# Patient Record
Sex: Female | Born: 1950 | Race: White | Hispanic: No | Marital: Married | Smoking: Never smoker
Health system: Southern US, Community
[De-identification: ages and names within clinical notes are randomized; demographics above are authoritative.]

## PROBLEM LIST (undated history)

## (undated) DIAGNOSIS — R002 Palpitations: Secondary | ICD-10-CM

## (undated) DIAGNOSIS — I491 Atrial premature depolarization: Secondary | ICD-10-CM

## (undated) DIAGNOSIS — E669 Obesity, unspecified: Secondary | ICD-10-CM

## (undated) HISTORY — PX: TONSILLECTOMY: SUR1361

## (undated) HISTORY — PX: TUBAL LIGATION: SHX77

---

## 2015-01-06 ENCOUNTER — Other Ambulatory Visit: Payer: Self-pay | Admitting: Otolaryngology

## 2015-01-06 DIAGNOSIS — H905 Unspecified sensorineural hearing loss: Secondary | ICD-10-CM

## 2015-01-19 ENCOUNTER — Ambulatory Visit: Payer: PPO

## 2015-02-09 ENCOUNTER — Ambulatory Visit: Payer: PPO

## 2015-03-09 ENCOUNTER — Ambulatory Visit: Payer: PPO

## 2015-03-17 ENCOUNTER — Ambulatory Visit: Payer: PPO

## 2015-03-30 ENCOUNTER — Ambulatory Visit
Admission: RE | Admit: 2015-03-30 | Discharge: 2015-03-30 | Disposition: A | Discharge status: Home / Self Care | Payer: PPO | Source: Ambulatory Visit | Attending: Otolaryngology | Admitting: Otolaryngology

## 2015-03-30 ENCOUNTER — Ambulatory Visit: Admission: RE | Admit: 2015-03-30 | Payer: PPO | Source: Ambulatory Visit

## 2015-03-30 DIAGNOSIS — H90A22 Sensorineural hearing loss, unilateral, left ear, with restricted hearing on the contralateral side: Secondary | ICD-10-CM | POA: Diagnosis not present

## 2015-03-30 DIAGNOSIS — H933X2 Disorders of left acoustic nerve: Secondary | ICD-10-CM | POA: Diagnosis not present

## 2015-03-30 DIAGNOSIS — H9193 Unspecified hearing loss, bilateral: Secondary | ICD-10-CM | POA: Diagnosis not present

## 2015-03-30 DIAGNOSIS — R9082 White matter disease, unspecified: Secondary | ICD-10-CM | POA: Insufficient documentation

## 2015-03-30 MED ORDER — GADOBENATE DIMEGLUMINE 529 MG/ML IV SOLN
20.0000 mL | Freq: Once | INTRAVENOUS | Status: AC | PRN
  Administered 2015-03-30: 20 mL via INTRAVENOUS

## 2015-04-28 DIAGNOSIS — H9193 Unspecified hearing loss, bilateral: Secondary | ICD-10-CM | POA: Diagnosis not present

## 2015-04-28 DIAGNOSIS — R05 Cough: Secondary | ICD-10-CM | POA: Diagnosis not present

## 2015-04-28 DIAGNOSIS — W57XXXA Bitten or stung by nonvenomous insect and other nonvenomous arthropods, initial encounter: Secondary | ICD-10-CM | POA: Diagnosis not present

## 2015-04-28 DIAGNOSIS — B078 Other viral warts: Secondary | ICD-10-CM | POA: Diagnosis not present

## 2015-04-28 DIAGNOSIS — R0789 Other chest pain: Secondary | ICD-10-CM | POA: Diagnosis not present

## 2015-06-16 DIAGNOSIS — Z1211 Encounter for screening for malignant neoplasm of colon: Secondary | ICD-10-CM | POA: Diagnosis not present

## 2015-06-23 DIAGNOSIS — R0789 Other chest pain: Secondary | ICD-10-CM | POA: Diagnosis not present

## 2015-06-23 DIAGNOSIS — I491 Atrial premature depolarization: Secondary | ICD-10-CM | POA: Diagnosis not present

## 2015-06-23 DIAGNOSIS — R002 Palpitations: Secondary | ICD-10-CM | POA: Diagnosis not present

## 2015-08-12 ENCOUNTER — Encounter: Payer: Self-pay | Admitting: *Deleted

## 2015-08-13 ENCOUNTER — Ambulatory Visit
Admission: RE | Admit: 2015-08-13 | Discharge: 2015-08-13 | Disposition: A | Discharge status: Home / Self Care | Payer: PPO | Source: Ambulatory Visit | Attending: Unknown Physician Specialty | Admitting: Unknown Physician Specialty

## 2015-08-13 ENCOUNTER — Encounter
Admission: RE | Disposition: A | Discharge status: Home / Self Care | Payer: Self-pay | Source: Ambulatory Visit | Attending: Unknown Physician Specialty

## 2015-08-13 ENCOUNTER — Ambulatory Visit: Payer: PPO | Admitting: Anesthesiology

## 2015-08-13 ENCOUNTER — Encounter: Payer: Self-pay | Admitting: Anesthesiology

## 2015-08-13 DIAGNOSIS — K640 First degree hemorrhoids: Secondary | ICD-10-CM | POA: Diagnosis not present

## 2015-08-13 DIAGNOSIS — Z6830 Body mass index (BMI) 30.0-30.9, adult: Secondary | ICD-10-CM | POA: Insufficient documentation

## 2015-08-13 DIAGNOSIS — K635 Polyp of colon: Secondary | ICD-10-CM | POA: Diagnosis not present

## 2015-08-13 DIAGNOSIS — K621 Rectal polyp: Secondary | ICD-10-CM | POA: Diagnosis not present

## 2015-08-13 DIAGNOSIS — K5790 Diverticulosis of intestine, part unspecified, without perforation or abscess without bleeding: Secondary | ICD-10-CM | POA: Diagnosis not present

## 2015-08-13 DIAGNOSIS — Z7982 Long term (current) use of aspirin: Secondary | ICD-10-CM | POA: Insufficient documentation

## 2015-08-13 DIAGNOSIS — K5730 Diverticulosis of large intestine without perforation or abscess without bleeding: Secondary | ICD-10-CM | POA: Insufficient documentation

## 2015-08-13 DIAGNOSIS — E669 Obesity, unspecified: Secondary | ICD-10-CM | POA: Insufficient documentation

## 2015-08-13 DIAGNOSIS — Z1211 Encounter for screening for malignant neoplasm of colon: Secondary | ICD-10-CM | POA: Diagnosis not present

## 2015-08-13 DIAGNOSIS — D128 Benign neoplasm of rectum: Secondary | ICD-10-CM | POA: Diagnosis not present

## 2015-08-13 DIAGNOSIS — K648 Other hemorrhoids: Secondary | ICD-10-CM | POA: Diagnosis not present

## 2015-08-13 HISTORY — PX: COLONOSCOPY WITH PROPOFOL: SHX5780

## 2015-08-13 HISTORY — DX: Palpitations: R00.2

## 2015-08-13 HISTORY — DX: Obesity, unspecified: E66.9

## 2015-08-13 HISTORY — DX: Atrial premature depolarization: I49.1

## 2015-08-13 SURGERY — COLONOSCOPY WITH PROPOFOL
Anesthesia: General

## 2015-08-13 MED ORDER — LIDOCAINE HCL (CARDIAC) 20 MG/ML IV SOLN
INTRAVENOUS | Status: DC | PRN
  Administered 2015-08-13: 30 mg via INTRAVENOUS

## 2015-08-13 MED ORDER — PROPOFOL 10 MG/ML IV BOLUS
INTRAVENOUS | Status: DC | PRN
  Administered 2015-08-13: 90 mg via INTRAVENOUS

## 2015-08-13 MED ORDER — SODIUM CHLORIDE 0.9 % IV SOLN: INTRAVENOUS | Status: DC

## 2015-08-13 MED ORDER — SODIUM CHLORIDE 0.9 % IV SOLN
INTRAVENOUS | Status: DC
  Administered 2015-08-13: 1000 mL via INTRAVENOUS

## 2015-08-13 MED ORDER — PROPOFOL 500 MG/50ML IV EMUL
INTRAVENOUS | Status: DC | PRN
  Administered 2015-08-13: 125 ug/kg/min via INTRAVENOUS

## 2015-08-13 NOTE — Anesthesia Preprocedure Evaluation (Signed)
Anesthesia Evaluation  Patient identified by MRN, date of birth, ID band Patient awake    Reviewed: Allergy & Precautions, H&P , NPO status , Patient's Chart, lab work & pertinent test results, reviewed documented beta blocker date and time   History of Anesthesia Complications (+) PROLONGED EMERGENCE and history of anesthetic complications  Airway Mallampati: I  TM Distance: >3 FB Neck ROM: full    Dental no notable dental hx. (+) Missing   Pulmonary neg shortness of breath, asthma (no symptoms for 25 years) , neg sleep apnea, neg COPD, neg recent URI,    Pulmonary exam normal breath sounds clear to auscultation       Cardiovascular Exercise Tolerance: Good (-) hypertension(-) angina(-) CAD, (-) Past MI, (-) Cardiac Stents and (-) CABG Normal cardiovascular exam+ dysrhythmias (PACs) (-) Valvular Problems/Murmurs Rhythm:regular Rate:Normal     Neuro/Psych negative neurological ROS  negative psych ROS   GI/Hepatic Neg liver ROS, GERD  ,  Endo/Other  negative endocrine ROS  Renal/GU negative Renal ROS  negative genitourinary   Musculoskeletal   Abdominal   Peds  Hematology negative hematology ROS (+)   Anesthesia Other Findings Past Medical History: No date: Obesity (BMI 30-39.9) No date: PAC (premature atrial contraction) No date: Palpitations   Reproductive/Obstetrics negative OB ROS                             Anesthesia Physical Anesthesia Plan  ASA: II  Anesthesia Plan: General   Post-op Pain Management:    Induction:   Airway Management Planned:   Additional Equipment:   Intra-op Plan:   Post-operative Plan:   Informed Consent: I have reviewed the patients History and Physical, chart, labs and discussed the procedure including the risks, benefits and alternatives for the proposed anesthesia with the patient or authorized representative who has indicated his/her  understanding and acceptance.   Dental Advisory Given  Plan Discussed with: Anesthesiologist, CRNA and Surgeon  Anesthesia Plan Comments:         Anesthesia Quick Evaluation

## 2015-08-13 NOTE — Anesthesia Postprocedure Evaluation (Signed)
Anesthesia Post Note  Patient: Susan Cunningham  Procedure(s) Performed: Procedure(s) (LRB): COLONOSCOPY WITH PROPOFOL (N/A)  Patient location during evaluation: Endoscopy Anesthesia Type: General Level of consciousness: awake and alert Pain management: pain level controlled Vital Signs Assessment: post-procedure vital signs reviewed and stable Respiratory status: spontaneous breathing, nonlabored ventilation, respiratory function stable and patient connected to nasal cannula oxygen Cardiovascular status: blood pressure returned to baseline and stable Postop Assessment: no signs of nausea or vomiting Anesthetic complications: no    Last Vitals:  Vitals:   08/13/15 1000 08/13/15 1010  BP: (!) 119/51 (!) 119/59  Pulse:    Resp:    Temp:      Last Pain:  Vitals:   08/13/15 0940  TempSrc: Tympanic                 Martha Clan

## 2015-08-13 NOTE — Transfer of Care (Signed)
Immediate Anesthesia Transfer of Care Note  Patient: Susan Cunningham  Procedure(s) Performed: Procedure(s): COLONOSCOPY WITH PROPOFOL (N/A)  Patient Location: Endoscopy Unit  Anesthesia Type:General  Level of Consciousness: awake and alert   Airway & Oxygen Therapy: Patient Spontanous Breathing and Patient connected to nasal cannula oxygen  Post-op Assessment: Report given to RN and Post -op Vital signs reviewed and stable  Post vital signs: Reviewed and stable  Last Vitals:  Vitals:   08/13/15 0856  BP: (!) 144/64  Pulse: 78  Resp: 16  Temp: 36.7 C    Last Pain:  Vitals:   08/13/15 0856  TempSrc: Tympanic         Complications: No apparent anesthesia complications

## 2015-08-13 NOTE — Op Note (Signed)
[facility] Gastroenterology Patient Name: Susan Cunningham Procedure Date: 08/13/2015 9:21 AM MRN: [id number] Account #: [id number] Date of Birth: 29-Jun-1950 Admit Type: Outpatient Age: 65 Room: [facility] ENDO ROOM 4 Gender: Female Note Status: Finalized Procedure:            Colonoscopy Indications:          Screening for colorectal malignant neoplasm Providers:            Manya Silvas, MD Referring MD:         Wynona Canes. Kym Groom, MD (Referring MD) Medicines:            Propofol per Anesthesia Complications:        No immediate complications. Procedure:            Pre-Anesthesia Assessment:                       - After reviewing the risks and benefits, the patient                        was deemed in satisfactory condition to undergo the                        procedure.                       After obtaining informed consent, the colonoscope was                        passed under direct vision. Throughout the procedure,                        the patient's blood pressure, pulse, and oxygen                        saturations were monitored continuously. The                        Colonoscope was introduced through the anus and                        advanced to the the cecum, identified by appendiceal                        orifice and ileocecal valve. The colonoscopy was                        performed without difficulty. The patient tolerated the                        procedure well. The quality of the bowel preparation                        was excellent. Findings:      Three sessile polyps were found in the rectum. The polyps were       diminutive in size. These polyps were removed with a jumbo cold forceps.       Resection and retrieval were complete.      Multiple medium-mouthed diverticula were found in the sigmoid colon,       descending colon, transverse colon and ascending colon.      Internal hemorrhoids were found  during endoscopy. The hemorrhoids  were       medium-sized and Grade I (internal hemorrhoids that do not prolapse).      The exam was otherwise without abnormality. Impression:           - Three diminutive polyps in the rectum, removed with a                        jumbo cold forceps. Resected and retrieved.                       - Diverticulosis in the sigmoid colon, in the                        descending colon, in the transverse colon and in the                        ascending colon.                       - Internal hemorrhoids.                       - The examination was otherwise normal. Recommendation:       - Await pathology results. Manya Silvas, MD 08/13/2015 9:44:17 AM This report has been signed electronically. Number of Addenda: 0 Note Initiated On: 08/13/2015 9:21 AM Scope Withdrawal Time: 0 hours 8 minutes 37 seconds  Total Procedure Duration: 0 hours 15 minutes 15 seconds       [facility]

## 2015-08-13 NOTE — H&P (Signed)
   Primary Care Physician:  Valera Castle, MD Primary Gastroenterologist:  Dr. Vira Agar  Pre-Procedure History & Physical: HPI:  Susan Cunningham is a 65 y.o. female is here for an colonoscopy.   Past Medical History:  Diagnosis Date  . Obesity (BMI 30-39.9)   . PAC (premature atrial contraction)   . Palpitations     Past Surgical History:  Procedure Laterality Date  . TONSILLECTOMY    . TUBAL LIGATION      Prior to Admission medications   Medication Sig Start Date End Date Taking? Authorizing Provider  aspirin EC 81 MG tablet Take 81 mg by mouth daily.   Yes Historical Provider, MD  montelukast (SINGULAIR) 10 MG tablet Take 10 mg by mouth at bedtime.   Yes Historical Provider, MD  Multiple Vitamin (MULTIVITAMIN) tablet Take 1 tablet by mouth daily.   Yes Historical Provider, MD    Allergies as of 07/17/2015  . (Not on File)    History reviewed. No pertinent family history.  Social History   Social History  . Marital status: Married    Spouse name: N/A  . Number of children: N/A  . Years of education: N/A   Occupational History  . Not on file.   Social History Main Topics  . Smoking status: Never Smoker  . Smokeless tobacco: Never Used  . Alcohol use Not on file  . Drug use: Unknown  . Sexual activity: Not on file   Other Topics Concern  . Not on file   Social History Narrative  . No narrative on file    Review of Systems: See HPI, otherwise negative ROS  Physical Exam: BP (!) 144/64   Pulse 78   Temp 98 F (36.7 C) (Tympanic)   Resp 16   Ht 5\' 3"  (1.6 m)   Wt 94.3 kg (208 lb)   SpO2 100%   BMI 36.85 kg/m  General:   Alert,  pleasant and cooperative in NAD Head:  Normocephalic and atraumatic. Neck:  Supple; no masses or thyromegaly. Lungs:  Clear throughout to auscultation.    Heart:  Regular rate and rhythm. Abdomen:  Soft, nontender and nondistended. Normal bowel sounds, without guarding, and without rebound.   Neurologic:  Alert  and  oriented x4;  grossly normal neurologically.  Impression/Plan: Susan Cunningham is here for an colonoscopy to be performed for screening  Risks, benefits, limitations, and alternatives regarding  colonoscopy have been reviewed with the patient.  Questions have been answered.  All parties agreeable.   Gaylyn Cheers, MD  08/13/2015, 9:15 AM

## 2015-08-14 ENCOUNTER — Encounter: Payer: Self-pay | Admitting: Unknown Physician Specialty

## 2015-08-14 LAB — SURGICAL PATHOLOGY

## 2015-09-30 ENCOUNTER — Other Ambulatory Visit: Payer: Self-pay | Admitting: Otolaryngology

## 2015-09-30 DIAGNOSIS — H9192 Unspecified hearing loss, left ear: Secondary | ICD-10-CM

## 2015-11-18 DIAGNOSIS — Z1231 Encounter for screening mammogram for malignant neoplasm of breast: Secondary | ICD-10-CM | POA: Diagnosis not present

## 2015-11-18 DIAGNOSIS — Z780 Asymptomatic menopausal state: Secondary | ICD-10-CM | POA: Diagnosis not present

## 2015-11-18 DIAGNOSIS — Z23 Encounter for immunization: Secondary | ICD-10-CM | POA: Diagnosis not present

## 2015-11-18 DIAGNOSIS — R002 Palpitations: Secondary | ICD-10-CM | POA: Diagnosis not present

## 2015-11-18 DIAGNOSIS — J9801 Acute bronchospasm: Secondary | ICD-10-CM | POA: Diagnosis not present

## 2015-11-18 DIAGNOSIS — Z1159 Encounter for screening for other viral diseases: Secondary | ICD-10-CM | POA: Diagnosis not present

## 2015-11-18 DIAGNOSIS — Z79899 Other long term (current) drug therapy: Secondary | ICD-10-CM | POA: Diagnosis not present

## 2015-11-18 DIAGNOSIS — M79601 Pain in right arm: Secondary | ICD-10-CM | POA: Diagnosis not present

## 2015-11-23 ENCOUNTER — Other Ambulatory Visit: Payer: Self-pay | Admitting: Family Medicine

## 2015-11-23 DIAGNOSIS — Z780 Asymptomatic menopausal state: Secondary | ICD-10-CM

## 2015-12-15 ENCOUNTER — Ambulatory Visit: Payer: PPO

## 2016-05-17 DIAGNOSIS — H25813 Combined forms of age-related cataract, bilateral: Secondary | ICD-10-CM | POA: Diagnosis not present

## 2016-06-03 DIAGNOSIS — H2513 Age-related nuclear cataract, bilateral: Secondary | ICD-10-CM | POA: Diagnosis not present

## 2016-06-03 DIAGNOSIS — H02839 Dermatochalasis of unspecified eye, unspecified eyelid: Secondary | ICD-10-CM | POA: Diagnosis not present

## 2016-06-03 DIAGNOSIS — H43812 Vitreous degeneration, left eye: Secondary | ICD-10-CM | POA: Diagnosis not present

## 2016-06-03 DIAGNOSIS — H18413 Arcus senilis, bilateral: Secondary | ICD-10-CM | POA: Diagnosis not present

## 2016-06-27 DIAGNOSIS — H2513 Age-related nuclear cataract, bilateral: Secondary | ICD-10-CM | POA: Diagnosis not present

## 2016-06-27 DIAGNOSIS — H2511 Age-related nuclear cataract, right eye: Secondary | ICD-10-CM | POA: Diagnosis not present

## 2016-06-28 DIAGNOSIS — R002 Palpitations: Secondary | ICD-10-CM | POA: Diagnosis not present

## 2016-06-28 DIAGNOSIS — I491 Atrial premature depolarization: Secondary | ICD-10-CM | POA: Diagnosis not present

## 2016-08-01 DIAGNOSIS — H2511 Age-related nuclear cataract, right eye: Secondary | ICD-10-CM | POA: Diagnosis not present

## 2016-08-02 DIAGNOSIS — H2512 Age-related nuclear cataract, left eye: Secondary | ICD-10-CM | POA: Diagnosis not present

## 2016-08-22 DIAGNOSIS — H2512 Age-related nuclear cataract, left eye: Secondary | ICD-10-CM | POA: Diagnosis not present

## 2017-01-23 DIAGNOSIS — Z780 Asymptomatic menopausal state: Secondary | ICD-10-CM | POA: Diagnosis not present

## 2017-01-23 DIAGNOSIS — Z0000 Encounter for general adult medical examination without abnormal findings: Secondary | ICD-10-CM | POA: Diagnosis not present

## 2017-01-23 DIAGNOSIS — Z1231 Encounter for screening mammogram for malignant neoplasm of breast: Secondary | ICD-10-CM | POA: Diagnosis not present

## 2017-01-23 DIAGNOSIS — Z23 Encounter for immunization: Secondary | ICD-10-CM | POA: Diagnosis not present

## 2017-01-23 DIAGNOSIS — J9801 Acute bronchospasm: Secondary | ICD-10-CM | POA: Diagnosis not present

## 2017-01-23 DIAGNOSIS — D485 Neoplasm of uncertain behavior of skin: Secondary | ICD-10-CM | POA: Diagnosis not present

## 2017-01-24 ENCOUNTER — Other Ambulatory Visit: Payer: Self-pay | Admitting: Family Medicine

## 2017-01-24 DIAGNOSIS — Z1239 Encounter for other screening for malignant neoplasm of breast: Secondary | ICD-10-CM

## 2017-01-24 DIAGNOSIS — Z780 Asymptomatic menopausal state: Secondary | ICD-10-CM

## 2017-08-01 DIAGNOSIS — R1032 Left lower quadrant pain: Secondary | ICD-10-CM | POA: Diagnosis not present

## 2017-08-01 DIAGNOSIS — R1031 Right lower quadrant pain: Secondary | ICD-10-CM | POA: Diagnosis not present

## 2017-08-01 DIAGNOSIS — M5432 Sciatica, left side: Secondary | ICD-10-CM | POA: Diagnosis not present

## 2017-08-01 DIAGNOSIS — M7040 Prepatellar bursitis, unspecified knee: Secondary | ICD-10-CM | POA: Diagnosis not present

## 2017-08-01 DIAGNOSIS — N390 Urinary tract infection, site not specified: Secondary | ICD-10-CM | POA: Diagnosis not present

## 2017-08-10 ENCOUNTER — Other Ambulatory Visit: Payer: Self-pay | Admitting: Family Medicine

## 2017-08-10 DIAGNOSIS — Z1231 Encounter for screening mammogram for malignant neoplasm of breast: Secondary | ICD-10-CM

## 2017-08-22 ENCOUNTER — Ambulatory Visit
Admission: RE | Admit: 2017-08-22 | Discharge: 2017-08-22 | Disposition: A | Discharge status: Home / Self Care | Payer: PPO | Source: Ambulatory Visit | Attending: Family Medicine | Admitting: Family Medicine

## 2017-08-22 DIAGNOSIS — Z1231 Encounter for screening mammogram for malignant neoplasm of breast: Secondary | ICD-10-CM | POA: Insufficient documentation

## 2017-08-22 DIAGNOSIS — Z780 Asymptomatic menopausal state: Secondary | ICD-10-CM | POA: Insufficient documentation

## 2017-08-23 ENCOUNTER — Other Ambulatory Visit: Payer: Self-pay | Admitting: Family Medicine

## 2017-08-23 DIAGNOSIS — N6320 Unspecified lump in the left breast, unspecified quadrant: Secondary | ICD-10-CM

## 2017-08-23 DIAGNOSIS — R928 Other abnormal and inconclusive findings on diagnostic imaging of breast: Secondary | ICD-10-CM

## 2017-08-31 ENCOUNTER — Ambulatory Visit
Admission: RE | Admit: 2017-08-31 | Discharge: 2017-08-31 | Disposition: A | Discharge status: Home / Self Care | Payer: PPO | Source: Ambulatory Visit | Attending: Family Medicine | Admitting: Family Medicine

## 2017-08-31 DIAGNOSIS — N6320 Unspecified lump in the left breast, unspecified quadrant: Secondary | ICD-10-CM | POA: Diagnosis not present

## 2017-08-31 DIAGNOSIS — N6321 Unspecified lump in the left breast, upper outer quadrant: Secondary | ICD-10-CM | POA: Diagnosis not present

## 2017-08-31 DIAGNOSIS — R928 Other abnormal and inconclusive findings on diagnostic imaging of breast: Secondary | ICD-10-CM | POA: Diagnosis not present

## 2017-08-31 DIAGNOSIS — R921 Mammographic calcification found on diagnostic imaging of breast: Secondary | ICD-10-CM | POA: Diagnosis not present

## 2017-09-07 ENCOUNTER — Other Ambulatory Visit: Payer: Self-pay | Admitting: Family Medicine

## 2017-09-07 DIAGNOSIS — N6459 Other signs and symptoms in breast: Secondary | ICD-10-CM

## 2017-09-11 ENCOUNTER — Other Ambulatory Visit: Payer: Self-pay

## 2017-09-11 ENCOUNTER — Ambulatory Visit
Admission: EM | Admit: 2017-09-11 | Discharge: 2017-09-11 | Disposition: A | Discharge status: Home / Self Care | Payer: PPO | Attending: Family Medicine | Admitting: Family Medicine

## 2017-09-11 ENCOUNTER — Encounter: Payer: Self-pay | Admitting: Gynecology

## 2017-09-11 DIAGNOSIS — L0109 Other impetigo: Secondary | ICD-10-CM

## 2017-09-11 DIAGNOSIS — L239 Allergic contact dermatitis, unspecified cause: Secondary | ICD-10-CM

## 2017-09-11 DIAGNOSIS — R21 Rash and other nonspecific skin eruption: Secondary | ICD-10-CM | POA: Diagnosis not present

## 2017-09-11 DIAGNOSIS — L0100 Impetigo, unspecified: Secondary | ICD-10-CM

## 2017-09-11 MED ORDER — MUPIROCIN 2 % EX OINT: TOPICAL_OINTMENT | CUTANEOUS | 0 refills | Status: AC

## 2017-09-11 MED ORDER — PREDNISONE 10 MG PO TABS: ORAL_TABLET | ORAL | 0 refills | Status: AC

## 2017-09-11 MED ORDER — CEPHALEXIN 500 MG PO CAPS: 500.0000 mg | ORAL_CAPSULE | Freq: Three times a day (TID) | ORAL | 0 refills | Status: AC

## 2017-09-11 NOTE — ED Provider Notes (Signed)
[facility] ____________________________________________  Time seen: Approximately 10:52 AM  I have reviewed the triage vital signs and the nursing notes.   HISTORY  Chief Complaint Rash  HPI Susan Cunningham is a 67 y.o. female presenting with husband at bedside for evaluation of face and neck rash that started Friday night into Saturday.  Reports that Friday she was outside working and trimming butterfly bushes, stating that she was very much so into the [place].  States did not actually see any poison oak or ivy to her knowledge.  States that evening left lower face started with a very itchy small rash.  States over the last 2 days the rash is continuously spread to both sides of her face as well as both sides of her neck.  Rash is worse to the left side of face with slight swelling.  Denies any pain.  States rash is very itchy.  Denies any vision changes, eye pain, eye discomfort or eye involvement.  No fevers or trauma.  Denies any paresthesias, unilateral weakness, taste changes, confusion.  Denies insect bites.  Continues to eat and drink well.  Unresolved with intermittent Benadryl.  Denies other aggravating or alleviating factors.  Reports otherwise feels well.  Denies changes in foods, medicines, lotions, detergents or other contacts.  Denies chest pain, shortness of breath, abdominal pain, lip swelling, tongue swelling, difficulty swallowing or shortness of breath. Denies recent sickness. Denies recent antibiotic use.   Valera Castle, MD: PCP Reports tetanus immunization is up-to-date.   Past Medical History:  Diagnosis Date  . Obesity (BMI 30-39.9)   . PAC (premature atrial contraction)   . Palpitations     There are no active problems to display for this patient.   Past Surgical History:  Procedure Laterality Date  . COLONOSCOPY WITH PROPOFOL N/A 08/13/2015   Procedure: COLONOSCOPY WITH PROPOFOL;  Surgeon: Manya Silvas, MD;  Location: [facility] ENDOSCOPY;   Service: Endoscopy;  Laterality: N/A;  . TONSILLECTOMY    . TUBAL LIGATION       No current facility-administered medications for this encounter.   Current Outpatient Medications:  .  aspirin EC 81 MG tablet, Take 81 mg by mouth daily., Disp: , Rfl:  .  montelukast (SINGULAIR) 10 MG tablet, Take 10 mg by mouth at bedtime., Disp: , Rfl:  .  Multiple Vitamin (MULTIVITAMIN) tablet, Take 1 tablet by mouth daily., Disp: , Rfl:  .  cephALEXin (KEFLEX) 500 MG capsule, Take 1 capsule (500 mg total) by mouth 3 (three) times daily for 7 days., Disp: 21 capsule, Rfl: 0 .  mupirocin ointment (BACTROBAN) 2 %, Apply two times a day for 7 days., Disp: 22 g, Rfl: 0 .  predniSONE (DELTASONE) 10 MG tablet, Take 60mg  orally day one, then 55 mg orally day two, then 50 mg orally day three, then taper by 5 mg daily over 12 days until complete., Disp: 35 tablet, Rfl: 0  Allergies Shellfish allergy and Oxycodone  Family History  Problem Relation Age of Onset  . Breast cancer Neg Hx     Social History Social History   Tobacco Use  . Smoking status: Never Smoker  . Smokeless tobacco: Never Used  Substance Use Topics  . Alcohol use: Yes  . Drug use: Never    Review of Systems Constitutional: No fever/chills Eyes: No visual changes. ENT: No sore throat. Cardiovascular: Denies chest pain. Respiratory: Denies shortness of breath. Gastrointestinal: No abdominal pain.   Skin: positive for rash. Neurological: Negative for  headaches, focal weakness or numbness.   ____________________________________________   PHYSICAL EXAM:  VITAL SIGNS: ED Triage Vitals  Enc Vitals Group     BP 09/11/17 1019 (!) 144/70     Pulse Rate 09/11/17 1019 79     Resp 09/11/17 1019 16     Temp 09/11/17 1019 98.7 F (37.1 C)     Temp Source 09/11/17 1019 Oral     SpO2 09/11/17 1019 100 %     Weight 09/11/17 1017 200 lb (90.7 kg)     Height 09/11/17 1017 5\' 3"  (1.6 m)     Head Circumference --      Peak Flow --        Pain Score 09/11/17 1017 0     Pain Loc --      Pain Edu? --      Excl. in [place]? --     Constitutional: Alert and oriented. Well appearing and in no acute distress. Eyes: Conjunctivae are normal. PERRL. EOMI. ENT      Head: Normocephalic and atraumatic.      Ears: Nontender, no erythema, normal TMs bilaterally.      Nose: No congestion/rhinnorhea.      Mouth/Throat: Mucous membranes are moist.Oropharynx non-erythematous.  No lip, tongue or oral pharyngeal edema noted. Neck: No stridor. Supple without meningismus.  Hematological/Lymphatic/Immunilogical: No cervical lymphadenopathy. Cardiovascular: Normal rate, regular rhythm. Grossly normal heart sounds.  Good peripheral circulation. Respiratory: Normal respiratory effort without tachypnea nor retractions. Breath sounds are clear and equal bilaterally. No wheezes, rales, rhonchi. Musculoskeletal: Steady gait.  No extremity edema noted. Neurologic:  Normal speech and language.  Speech is normal. No gait instability.  Skin:  Skin is warm, dry.  Except: Erythematous macular and papular pruritic rash present to bilateral face worse on left cheek, left cheek mildly indurated with scant honey colored crusting, nontender, same rash present to bilateral neck and upper chest, no paresthesias, no oral edema, no other rash noted.  Psychiatric: Mood and affect are normal. Speech and behavior are normal. Patient exhibits appropriate insight and judgment   ___________________________________________   LABS (all labs ordered are listed, but only abnormal results are displayed)  Labs Reviewed - No data to display  PROCEDURES Procedures   INITIAL IMPRESSION / ASSESSMENT AND PLAN / ED COURSE  Pertinent labs & imaging results that were available during my care of the patient were reviewed by me and considered in my medical decision making (see chart for details).  Very well-appearing patient.  No acute distress.  Rash pattern and clinical  presentation suspect plant contact dermatitis as well as concern for secondary impetigo.  Discussed patient and reviewed with Dr. Lacinda Axon who also saw patient agrees with plan.  Will treat with prednisone taper, oral Keflex and topical Bactroban.  Continue to monitor and avoid trigger.  Encourage rest, fluids, supportive care and continue home antihistamines.  Very strict follow-up and return parameters given.Discussed indication, risks and benefits of medications with patient.  Discussed follow up with Primary care physician this week. Discussed follow up and return parameters including no resolution or any worsening concerns including pain, eye involvement, vision changes, swelling, drainage. Patient verbalized understanding and agreed to plan.   ____________________________________________   FINAL CLINICAL IMPRESSION(S) / ED DIAGNOSES  Final diagnoses:  Allergic contact dermatitis, unspecified trigger  Impetigo     ED Discharge Orders         Ordered    cephALEXin (KEFLEX) 500 MG capsule  3 times daily  09/11/17 1044    predniSONE (DELTASONE) 10 MG tablet     09/11/17 1044    mupirocin ointment (BACTROBAN) 2 %     09/11/17 1044           Note: This dictation was prepared with Dragon dictation along with smaller phrase technology. Any transcriptional errors that result from this process are unintentional.         Marylene Land, NP 09/11/17 1103

## 2017-09-11 NOTE — ED Triage Notes (Signed)
Per patient x 3 days rash on face and neck. Per patient working in garden.

## 2017-09-11 NOTE — Discharge Instructions (Addendum)
Take medication as prescribed. Rest. Drink plenty of fluids. Monitor closely.   Follow up with your primary care physician this week as needed. Return to Urgent care for new or worsening concerns.   

## 2017-09-18 DIAGNOSIS — L853 Xerosis cutis: Secondary | ICD-10-CM | POA: Diagnosis not present

## 2017-09-18 DIAGNOSIS — L239 Allergic contact dermatitis, unspecified cause: Secondary | ICD-10-CM | POA: Diagnosis not present

## 2017-09-18 DIAGNOSIS — Z23 Encounter for immunization: Secondary | ICD-10-CM | POA: Diagnosis not present

## 2017-09-19 ENCOUNTER — Other Ambulatory Visit: Payer: Self-pay | Admitting: Otolaryngology

## 2017-09-19 DIAGNOSIS — H9313 Tinnitus, bilateral: Secondary | ICD-10-CM

## 2017-09-19 DIAGNOSIS — H9042 Sensorineural hearing loss, unilateral, left ear, with unrestricted hearing on the contralateral side: Secondary | ICD-10-CM | POA: Diagnosis not present

## 2017-09-19 DIAGNOSIS — H9319 Tinnitus, unspecified ear: Secondary | ICD-10-CM | POA: Diagnosis not present

## 2017-09-29 ENCOUNTER — Ambulatory Visit
Admission: RE | Admit: 2017-09-29 | Discharge: 2017-09-29 | Disposition: A | Discharge status: Home / Self Care | Payer: PPO | Source: Ambulatory Visit | Attending: Otolaryngology | Admitting: Otolaryngology

## 2017-09-29 ENCOUNTER — Other Ambulatory Visit
Admission: RE | Admit: 2017-09-29 | Discharge: 2017-09-29 | Disposition: A | Discharge status: Home / Self Care | Payer: PPO | Source: Ambulatory Visit | Attending: Otolaryngology | Admitting: Otolaryngology

## 2017-09-29 DIAGNOSIS — H9190 Unspecified hearing loss, unspecified ear: Secondary | ICD-10-CM | POA: Diagnosis not present

## 2017-09-29 DIAGNOSIS — H9313 Tinnitus, bilateral: Secondary | ICD-10-CM | POA: Diagnosis not present

## 2017-09-29 DIAGNOSIS — H9042 Sensorineural hearing loss, unilateral, left ear, with unrestricted hearing on the contralateral side: Secondary | ICD-10-CM | POA: Diagnosis not present

## 2017-09-29 LAB — CREATININE, SERUM
Creatinine, Ser: 0.72 mg/dL (ref 0.44–1.00)
GFR calc Af Amer: >60 mL/min (ref >60)

## 2017-09-29 MED ORDER — GADOBENATE DIMEGLUMINE 529 MG/ML IV SOLN
19.0000 mL | Freq: Once | INTRAVENOUS | Status: AC | PRN
  Administered 2017-09-29: 10 mL via INTRAVENOUS

## 2017-10-19 DIAGNOSIS — H9072 Mixed conductive and sensorineural hearing loss, unilateral, left ear, with unrestricted hearing on the contralateral side: Secondary | ICD-10-CM | POA: Diagnosis not present

## 2017-10-19 DIAGNOSIS — D333 Benign neoplasm of cranial nerves: Secondary | ICD-10-CM | POA: Diagnosis not present

## 2017-10-19 DIAGNOSIS — H9319 Tinnitus, unspecified ear: Secondary | ICD-10-CM | POA: Diagnosis not present

## 2018-01-24 DIAGNOSIS — Z0000 Encounter for general adult medical examination without abnormal findings: Secondary | ICD-10-CM | POA: Diagnosis not present

## 2018-01-24 DIAGNOSIS — Z13220 Encounter for screening for lipoid disorders: Secondary | ICD-10-CM | POA: Diagnosis not present

## 2018-01-24 DIAGNOSIS — B078 Other viral warts: Secondary | ICD-10-CM | POA: Diagnosis not present

## 2018-01-24 DIAGNOSIS — M7040 Prepatellar bursitis, unspecified knee: Secondary | ICD-10-CM | POA: Diagnosis not present

## 2018-01-24 DIAGNOSIS — L299 Pruritus, unspecified: Secondary | ICD-10-CM | POA: Diagnosis not present

## 2018-01-24 DIAGNOSIS — K219 Gastro-esophageal reflux disease without esophagitis: Secondary | ICD-10-CM | POA: Diagnosis not present

## 2018-10-05 DIAGNOSIS — R21 Rash and other nonspecific skin eruption: Secondary | ICD-10-CM | POA: Diagnosis not present

## 2018-11-30 ENCOUNTER — Other Ambulatory Visit: Payer: Self-pay | Admitting: Family Medicine

## 2018-11-30 DIAGNOSIS — N6320 Unspecified lump in the left breast, unspecified quadrant: Secondary | ICD-10-CM

## 2018-12-11 DIAGNOSIS — N8110 Cystocele, unspecified: Secondary | ICD-10-CM | POA: Diagnosis not present

## 2018-12-26 ENCOUNTER — Ambulatory Visit
Admission: RE | Admit: 2018-12-26 | Discharge: 2018-12-26 | Disposition: A | Discharge status: Home / Self Care | Payer: PPO | Source: Ambulatory Visit | Attending: Family Medicine | Admitting: Family Medicine

## 2018-12-26 DIAGNOSIS — N6002 Solitary cyst of left breast: Secondary | ICD-10-CM | POA: Diagnosis not present

## 2018-12-26 DIAGNOSIS — R928 Other abnormal and inconclusive findings on diagnostic imaging of breast: Secondary | ICD-10-CM | POA: Diagnosis not present

## 2018-12-26 DIAGNOSIS — N6320 Unspecified lump in the left breast, unspecified quadrant: Secondary | ICD-10-CM

## 2019-01-17 DIAGNOSIS — N393 Stress incontinence (female) (male): Secondary | ICD-10-CM | POA: Diagnosis not present

## 2019-01-17 DIAGNOSIS — N813 Complete uterovaginal prolapse: Secondary | ICD-10-CM | POA: Diagnosis not present

## 2019-01-17 DIAGNOSIS — N398 Other specified disorders of urinary system: Secondary | ICD-10-CM | POA: Diagnosis not present

## 2019-01-18 IMAGING — MR MR BRAIN/IAC WO/W
11 of 12 series · 41 of 48 positions shown · IV contrast (multihance)
Comparison: 03/30/2015.

CLINICAL DATA: Follow-up MRI for hearing loss, which began 3 years
ago.

EXAM:
MRI HEAD WITHOUT AND WITH CONTRAST
TECHNIQUE: Multiplanar, multiecho pulse sequences of the brain and surrounding
structures were obtained without and with intravenous contrast.
CONTRAST:  10mL MULTIHANCE GADOBENATE DIMEGLUMINE 529 MG/ML IV SOLN

[Series 2: T1 · sagittal · 5.0mm · 0.45mm/px · 3 of 23 slices shown (1 of 3)]
[im 1/23]
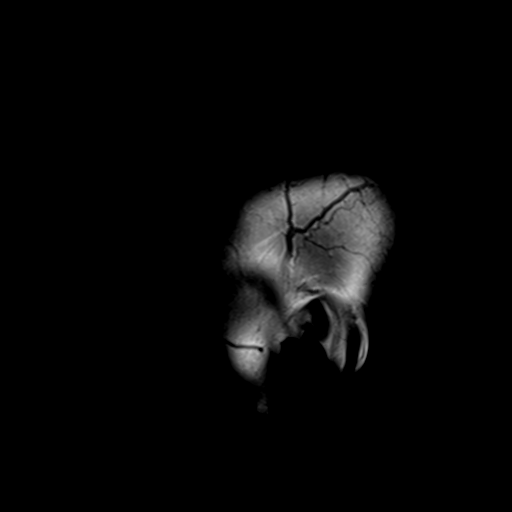
[im 12/23]
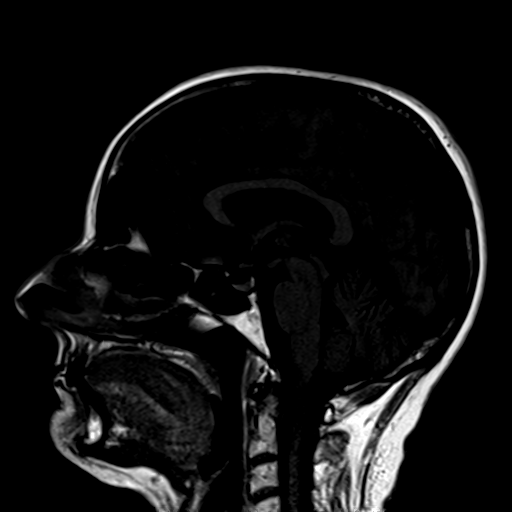
[im 23/23]
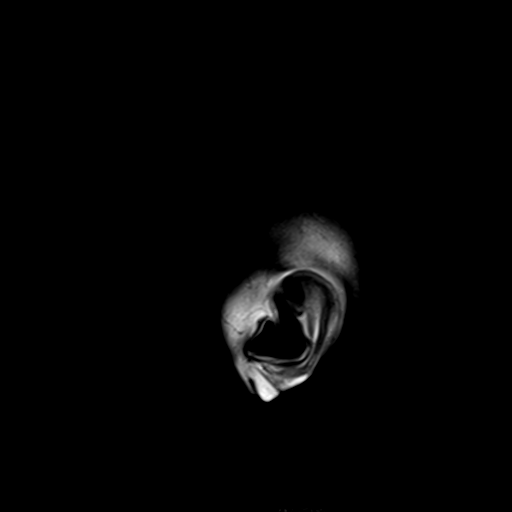

[Series 4: DWI · axial · 3.0mm · 1.20mm/px · z∈[-89,+72]mm · 7 of 55 slices shown (1 of 2)]
[im 1/55]
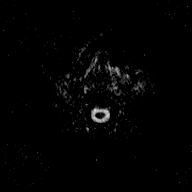
[im 10/55]
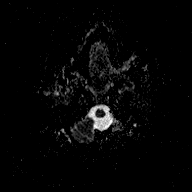
[im 19/55]
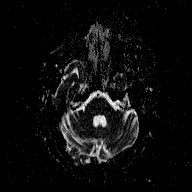
[im 28/55]
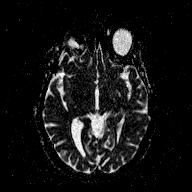
[im 37/55]
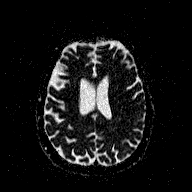
[im 46/55]
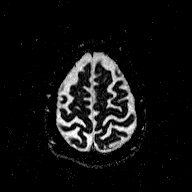
[im 55/55]
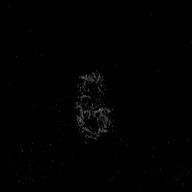

[Series 5: T2 · axial · 5.0mm · 0.72mm/px · z∈[-88,+71]mm · 3 of 24 slices shown (1 of 2)]
[im 1/24]
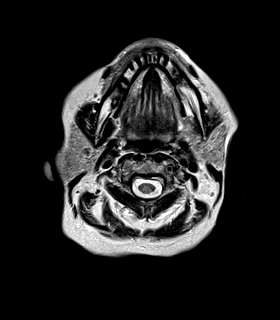
[im 12/24]
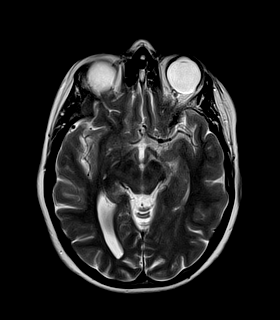
[im 24/24]
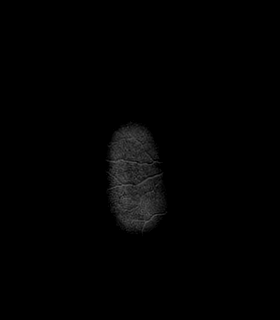

[Series 6: FLAIR · axial · 3.0mm · 0.45mm/px · z∈[-89,+72]mm · 7 of 55 slices shown]
[im 1/55]
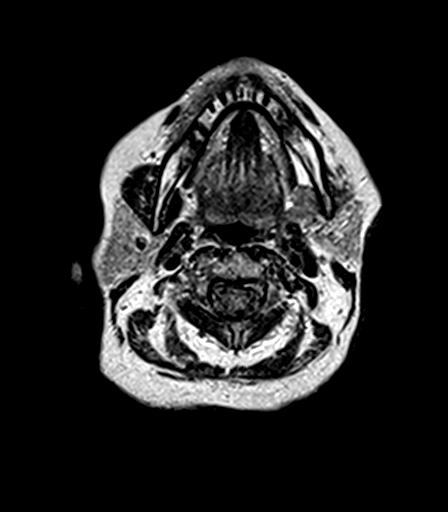
[im 10/55]
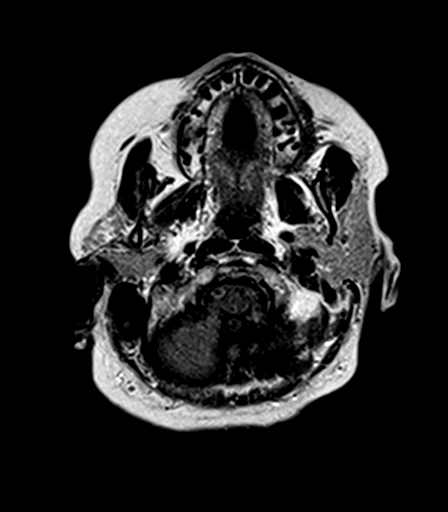
[im 19/55]
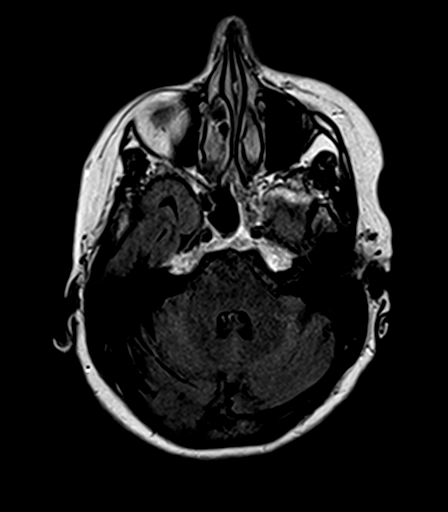
[im 28/55]
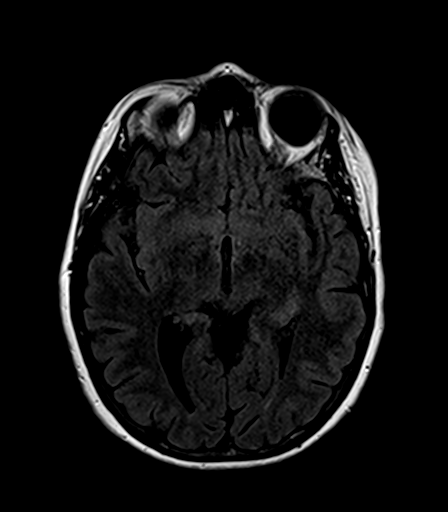
[im 37/55]
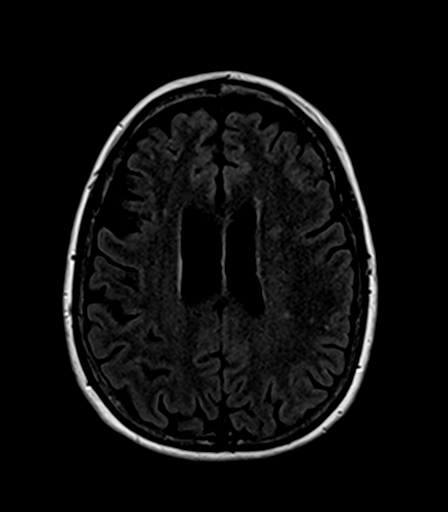
[im 46/55]
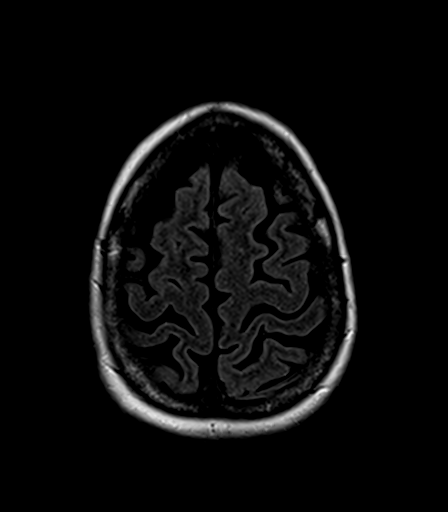
[im 55/55]
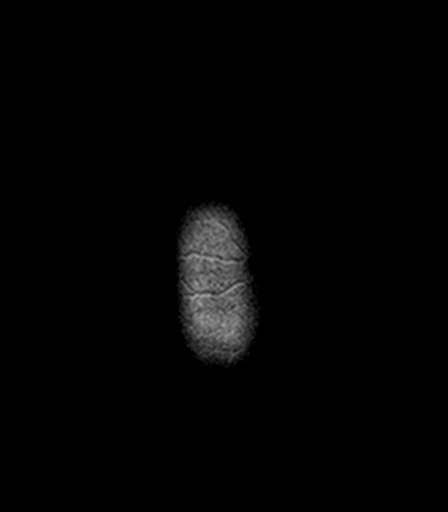

[Series 7: T2 · axial · 5.0mm · 0.72mm/px · z∈[-88,+71]mm · 3 of 24 slices shown (2 of 2)]
[im 1/24]
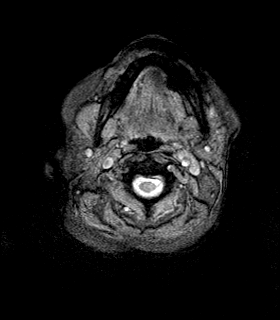
[im 12/24]
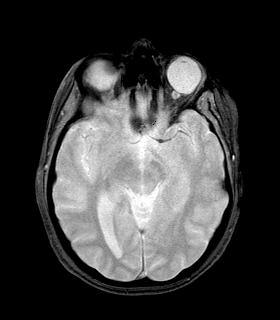
[im 24/24]
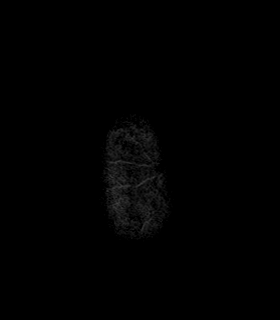

[Series 8: T1 · coronal · 3.0mm · 0.37mm/px · 1 of 11 slices shown (2 of 3)]
[im 1/11]
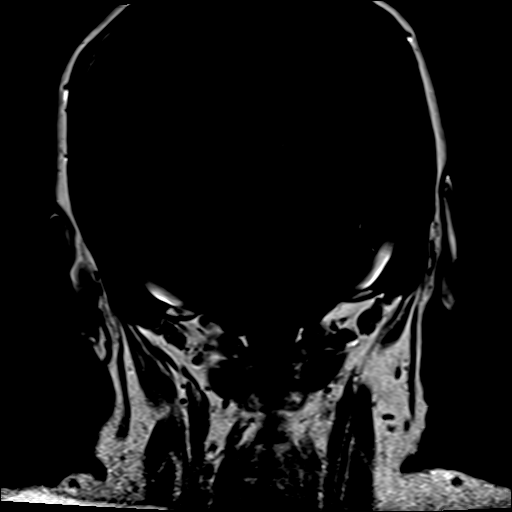

[Series 9: T1 · axial · 3.0mm · 0.37mm/px · 1 of 11 slices shown (3 of 3)]
[im 1/11]
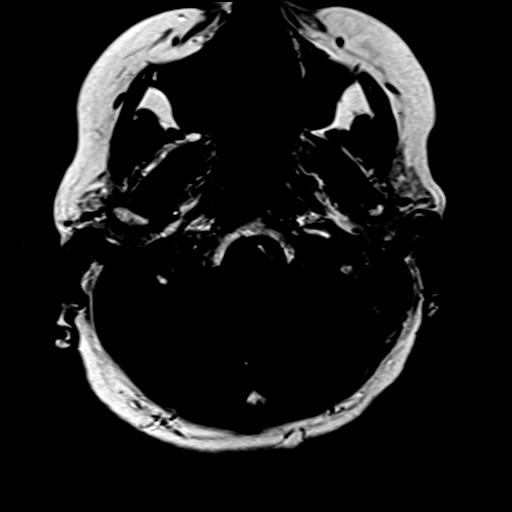

[Series 11: T1 post-contrast · axial · 3.0mm · 0.37mm/px · 1 of 11 slices shown (1 of 3)]
[im 1/11]
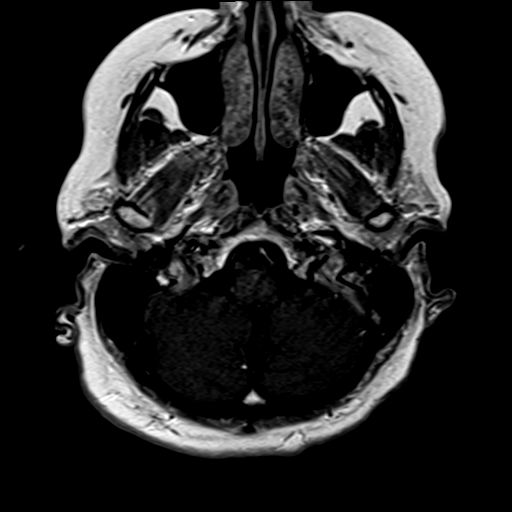

[Series 12: T1 post-contrast · axial · 3.0mm · 1.00mm/px · z∈[-99,+77]mm · 7 of 60 slices shown (2 of 3)]
[im 1/60]
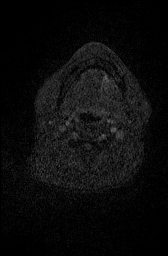
[im 10/60]
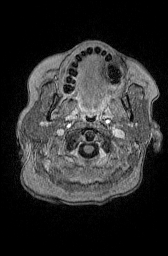
[im 20/60]
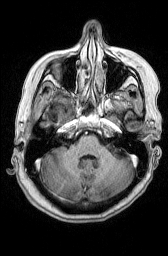
[im 30/60]
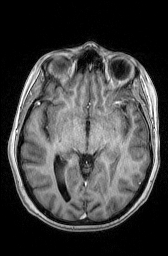
[im 40/60]
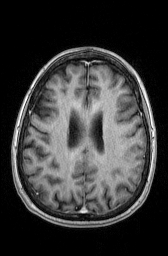
[im 50/60]
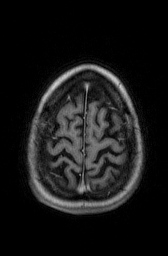
[im 60/60]
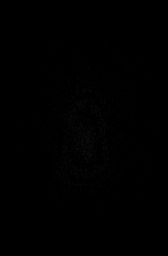

[Series 13: T1 post-contrast · coronal · 3.0mm · 0.37mm/px · 1 of 11 slices shown (3 of 3)]
[im 1/11]
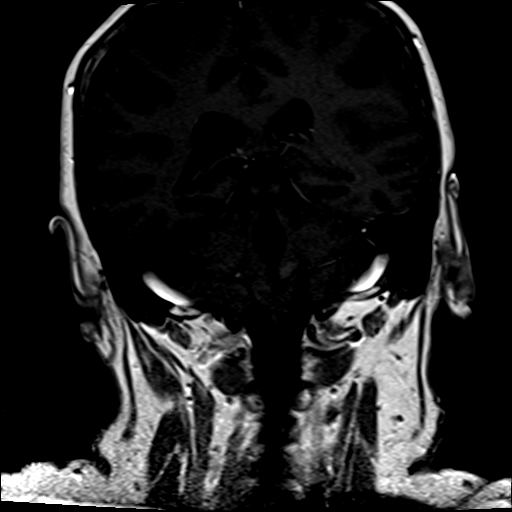

[Series 100: DWI · axial · 3.0mm · 1.20mm/px · z∈[-89,+72]mm · 7 of 55 slices shown (2 of 2)]
[im 1/55]
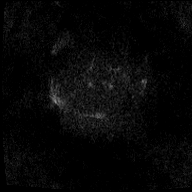
[im 10/55]
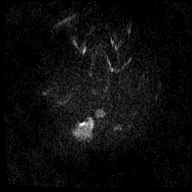
[im 19/55]
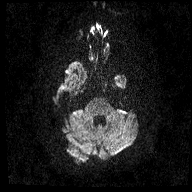
[im 28/55]
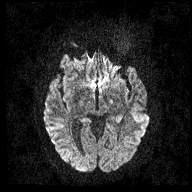
[im 37/55]
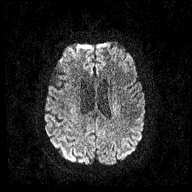
[im 46/55]
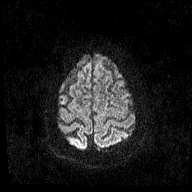
[im 55/55]
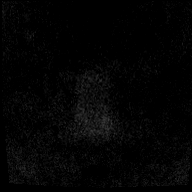

[41 of 48 positions shown; findings below may reference images not displayed]

FINDINGS: Brain: No acute infarction, hemorrhage, hydrocephalus, extra-axial
collection or mass lesion. Normal cerebral volume. Only minor white
matter disease. Post infusion, no abnormal enhancement of the brain
or meninges.

Thin-section imaging was performed through the posterior fossa. The
intracanalicular VIIth cranial nerve and all divisions of the VIIIth
cranial nerve are normal in size (as demonstrated on the axial
[place] sequence series 10), but a small area of linear enhancement
superiorly in the LEFT internal auditory canal is redemonstrated
(series 11, image 6). Estimated cross-section is 2 x 4 mm. This
likely represents a very small vestibular schwannoma, although a
small seventh nerve schwannoma could not completely be excluded. No
labyrinthine enhancement of the cochlear, vestibule, or semicircular
canals. No temporal bone inflammatory process. No change previous MR
in Wednesday March, 2015.

Vascular: Normal flow voids.

Skull and upper cervical spine: Normal marrow signal.

Sinuses/Orbits: Negative.

Other: None.
IMPRESSION: Small area of linear enhancement in the LEFT IAC superiorly, likely
represents a small vestibular schwannoma, which is stable from 9455.

Unremarkable intracranial compartment with stable cerebral volume
and only minor white matter disease.

## 2019-01-30 DIAGNOSIS — Z79899 Other long term (current) drug therapy: Secondary | ICD-10-CM | POA: Diagnosis not present

## 2019-01-30 DIAGNOSIS — Z0000 Encounter for general adult medical examination without abnormal findings: Secondary | ICD-10-CM | POA: Diagnosis not present

## 2019-01-30 DIAGNOSIS — N8110 Cystocele, unspecified: Secondary | ICD-10-CM | POA: Diagnosis not present

## 2019-02-20 DIAGNOSIS — N398 Other specified disorders of urinary system: Secondary | ICD-10-CM | POA: Diagnosis not present

## 2019-02-20 DIAGNOSIS — N950 Postmenopausal bleeding: Secondary | ICD-10-CM | POA: Diagnosis not present

## 2019-02-20 DIAGNOSIS — N813 Complete uterovaginal prolapse: Secondary | ICD-10-CM | POA: Diagnosis not present

## 2019-02-20 DIAGNOSIS — Z01818 Encounter for other preprocedural examination: Secondary | ICD-10-CM | POA: Diagnosis not present

## 2019-02-24 ENCOUNTER — Ambulatory Visit: Payer: PPO | Attending: Internal Medicine

## 2019-02-24 DIAGNOSIS — Z23 Encounter for immunization: Secondary | ICD-10-CM

## 2019-02-24 NOTE — Progress Notes (Signed)
   Covid-19 Vaccination Clinic  Name:  Susan Cunningham    MRN: [id number] DOB: January 12, 1950  02/24/2019  [name] was observed post Covid-19 immunization for 15 minutes without incidence. She was provided with Vaccine Information Sheet and instruction to access the V-Safe system.   [name] was instructed to call 911 with any severe reactions post vaccine: Marland Kitchen Difficulty breathing  . Swelling of your face and throat  . A fast heartbeat  . A bad rash all over your body  . Dizziness and weakness    Immunizations Administered    Name Date Dose VIS Date Route   Pfizer COVID-19 Vaccine 02/24/2019  9:15 AM 0.3 mL 12/21/2018 Intramuscular   Manufacturer: [place]   Lot: [id number]   [place]: [id number]

## 2019-03-07 DIAGNOSIS — Z20822 Contact with and (suspected) exposure to covid-19: Secondary | ICD-10-CM | POA: Diagnosis not present

## 2019-03-07 DIAGNOSIS — Z1159 Encounter for screening for other viral diseases: Secondary | ICD-10-CM | POA: Diagnosis not present

## 2019-03-08 DIAGNOSIS — Z885 Allergy status to narcotic agent status: Secondary | ICD-10-CM | POA: Diagnosis not present

## 2019-03-08 DIAGNOSIS — K219 Gastro-esophageal reflux disease without esophagitis: Secondary | ICD-10-CM | POA: Diagnosis not present

## 2019-03-08 DIAGNOSIS — N800 Endometriosis of uterus: Secondary | ICD-10-CM | POA: Diagnosis not present

## 2019-03-08 DIAGNOSIS — N210 Calculus in bladder: Secondary | ICD-10-CM | POA: Diagnosis not present

## 2019-03-08 DIAGNOSIS — N813 Complete uterovaginal prolapse: Secondary | ICD-10-CM | POA: Diagnosis not present

## 2019-03-08 DIAGNOSIS — K5730 Diverticulosis of large intestine without perforation or abscess without bleeding: Secondary | ICD-10-CM | POA: Diagnosis not present

## 2019-03-08 DIAGNOSIS — N816 Rectocele: Secondary | ICD-10-CM | POA: Diagnosis not present

## 2019-03-08 DIAGNOSIS — N323 Diverticulum of bladder: Secondary | ICD-10-CM | POA: Diagnosis not present

## 2019-03-08 DIAGNOSIS — E669 Obesity, unspecified: Secondary | ICD-10-CM | POA: Diagnosis not present

## 2019-03-08 DIAGNOSIS — Z6836 Body mass index (BMI) 36.0-36.9, adult: Secondary | ICD-10-CM | POA: Diagnosis not present

## 2019-03-08 DIAGNOSIS — N393 Stress incontinence (female) (male): Secondary | ICD-10-CM | POA: Diagnosis not present

## 2019-03-08 DIAGNOSIS — N879 Dysplasia of cervix uteri, unspecified: Secondary | ICD-10-CM | POA: Diagnosis not present

## 2019-03-08 HISTORY — PX: OOPHORECTOMY: SHX86

## 2019-03-20 ENCOUNTER — Ambulatory Visit: Payer: Self-pay | Attending: Internal Medicine

## 2019-03-20 DIAGNOSIS — Z23 Encounter for immunization: Secondary | ICD-10-CM

## 2019-03-20 NOTE — Progress Notes (Signed)
   Covid-19 Vaccination Clinic  Name:  Susan Cunningham    MRN: [id number] DOB: September 05, 1950  03/20/2019  Susan Cunningham was observed post Covid-19 immunization for 15 minutes without incident. She was provided with Vaccine Information Sheet and instruction to access the V-Safe system.   Susan Cunningham was instructed to call 911 with any severe reactions post vaccine: Marland Kitchen Difficulty breathing  . Swelling of face and throat  . A fast heartbeat  . A bad rash all over body  . Dizziness and weakness   Immunizations Administered    Name Date Dose VIS Date Route   Pfizer COVID-19 Vaccine 03/20/2019  2:21 PM 0.3 mL 12/21/2018 Intramuscular   Manufacturer: [place]   Lot: [id number]   [place]: [id number]

## 2019-06-26 DIAGNOSIS — T83721D Exposure of implanted vaginal mesh and other prosthetic materials into vagina, subsequent encounter: Secondary | ICD-10-CM | POA: Diagnosis not present

## 2019-08-02 DIAGNOSIS — Z90710 Acquired absence of both cervix and uterus: Secondary | ICD-10-CM | POA: Diagnosis not present

## 2019-08-02 DIAGNOSIS — Z6835 Body mass index (BMI) 35.0-35.9, adult: Secondary | ICD-10-CM | POA: Diagnosis not present

## 2019-08-02 DIAGNOSIS — K219 Gastro-esophageal reflux disease without esophagitis: Secondary | ICD-10-CM | POA: Diagnosis not present

## 2019-08-02 DIAGNOSIS — Z7982 Long term (current) use of aspirin: Secondary | ICD-10-CM | POA: Diagnosis not present

## 2019-08-02 DIAGNOSIS — T83721A Exposure of implanted vaginal mesh and other prosthetic materials into vagina, initial encounter: Secondary | ICD-10-CM | POA: Diagnosis not present

## 2019-08-02 DIAGNOSIS — Z79899 Other long term (current) drug therapy: Secondary | ICD-10-CM | POA: Diagnosis not present

## 2019-08-02 DIAGNOSIS — Y832 Surgical operation with anastomosis, bypass or graft as the cause of abnormal reaction of the patient, or of later complication, without mention of misadventure at the time of the procedure: Secondary | ICD-10-CM | POA: Diagnosis not present

## 2019-08-02 DIAGNOSIS — E668 Other obesity: Secondary | ICD-10-CM | POA: Diagnosis not present

## 2019-08-02 DIAGNOSIS — I440 Atrioventricular block, first degree: Secondary | ICD-10-CM | POA: Diagnosis not present

## 2019-09-10 ENCOUNTER — Other Ambulatory Visit: Payer: Self-pay | Admitting: Family Medicine

## 2019-09-10 DIAGNOSIS — Z1231 Encounter for screening mammogram for malignant neoplasm of breast: Secondary | ICD-10-CM

## 2019-11-25 DIAGNOSIS — R55 Syncope and collapse: Secondary | ICD-10-CM | POA: Diagnosis not present

## 2020-01-15 ENCOUNTER — Other Ambulatory Visit: Payer: Self-pay

## 2020-01-15 ENCOUNTER — Ambulatory Visit
Admission: RE | Admit: 2020-01-15 | Discharge: 2020-01-15 | Disposition: A | Discharge status: Home / Self Care | Payer: Medicare HMO | Source: Ambulatory Visit | Attending: Family Medicine | Admitting: Family Medicine

## 2020-01-15 DIAGNOSIS — Z1231 Encounter for screening mammogram for malignant neoplasm of breast: Secondary | ICD-10-CM | POA: Insufficient documentation

## 2020-12-15 ENCOUNTER — Other Ambulatory Visit: Payer: Self-pay | Admitting: Family Medicine

## 2020-12-15 DIAGNOSIS — Z1231 Encounter for screening mammogram for malignant neoplasm of breast: Secondary | ICD-10-CM

## 2021-02-15 ENCOUNTER — Ambulatory Visit: Payer: Medicare HMO

## 2021-03-17 ENCOUNTER — Ambulatory Visit
Admission: RE | Admit: 2021-03-17 | Discharge: 2021-03-17 | Disposition: A | Discharge status: Home / Self Care | Payer: Medicare HMO | Source: Ambulatory Visit | Attending: Family Medicine | Admitting: Family Medicine

## 2021-03-17 ENCOUNTER — Other Ambulatory Visit: Payer: Self-pay

## 2021-03-17 DIAGNOSIS — Z1231 Encounter for screening mammogram for malignant neoplasm of breast: Secondary | ICD-10-CM | POA: Insufficient documentation
